# Patient Record
Sex: Female | Born: 1980 | Race: White | Hispanic: No | Marital: Married | Smoking: Never smoker
Health system: Southern US, Community
[De-identification: ages and names within clinical notes are randomized; demographics above are authoritative.]

---

## 2005-06-07 ENCOUNTER — Emergency Department: Payer: Self-pay | Admitting: Unknown Physician Specialty

## 2006-02-15 ENCOUNTER — Ambulatory Visit: Payer: Self-pay | Admitting: Unknown Physician Specialty

## 2006-02-28 ENCOUNTER — Inpatient Hospital Stay: Payer: Self-pay | Admitting: Unknown Physician Specialty

## 2006-11-19 IMAGING — US US PELV - US TRANSVAGINAL
1 series · 17 of 25 positions shown · non-contrast
Comparison: none

REASON FOR EXAM: Abdominal pain
COMMENTS:

[Series 1: us pelv - us transvaginal · 17 of 27 slices shown]
[im 1/27]
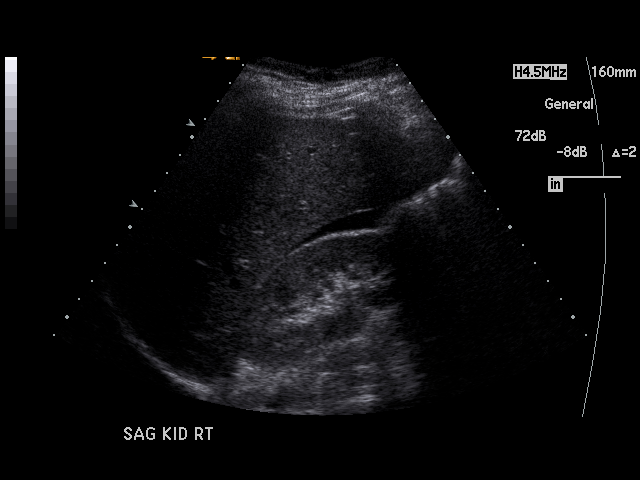
[im 3/27]
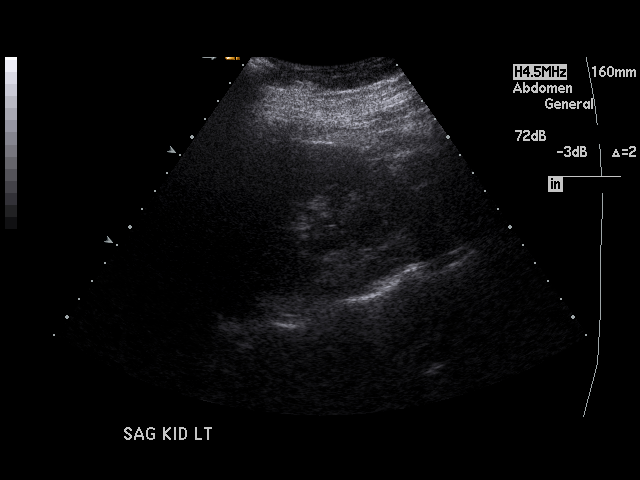
[im 4/27]
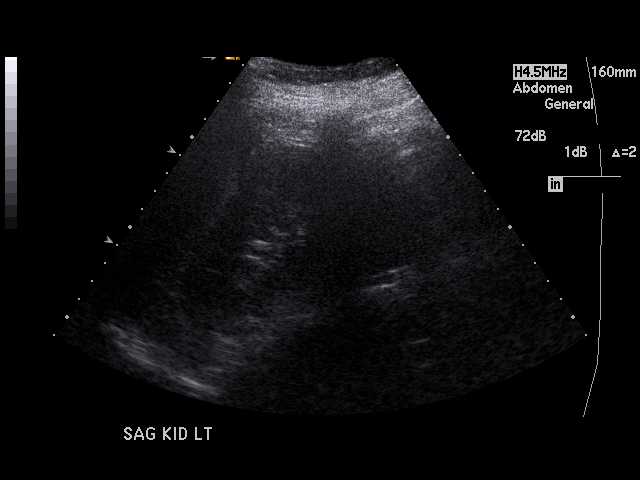
[im 6/27]
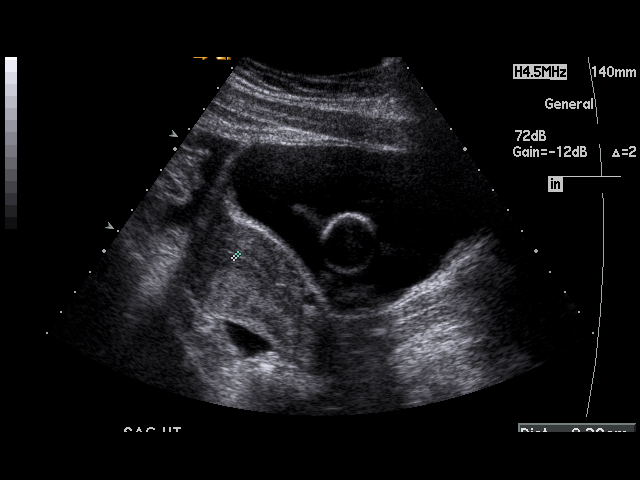
[im 7/27]
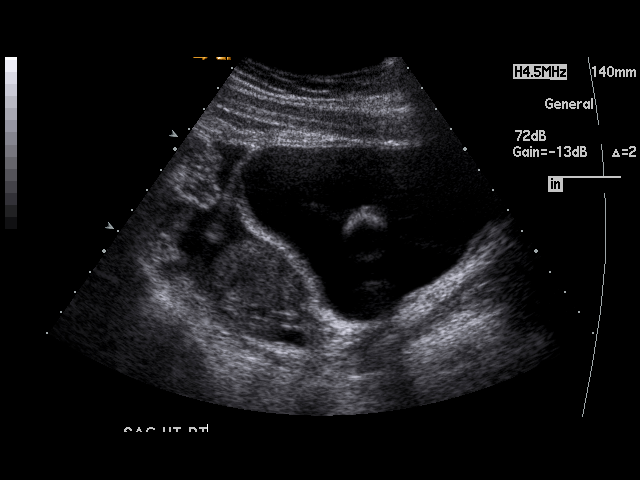
[im 9/27]
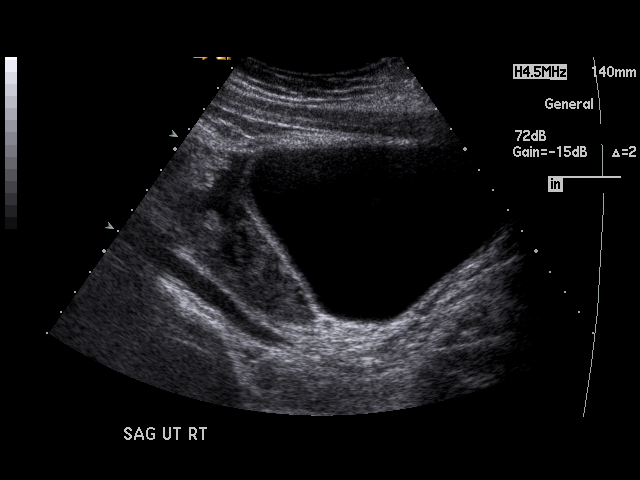
[im 10/27]
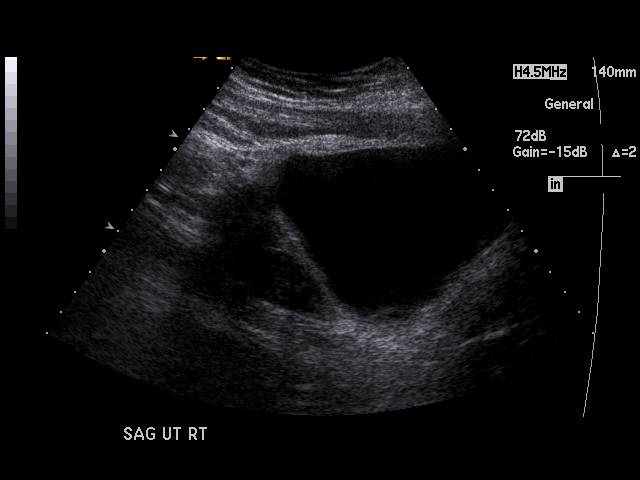
[im 12/27]
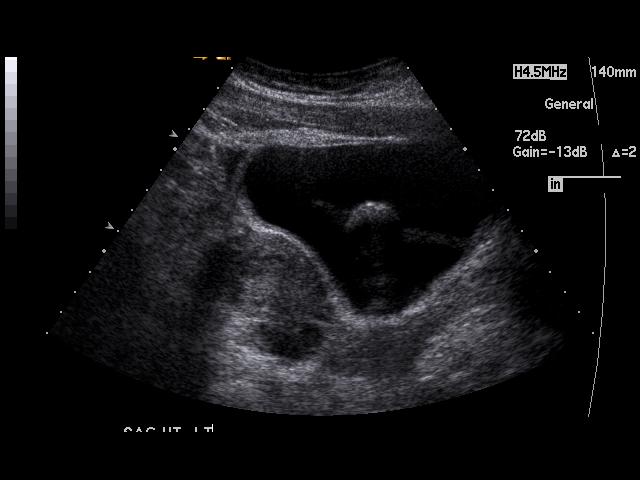
[im 14/27]
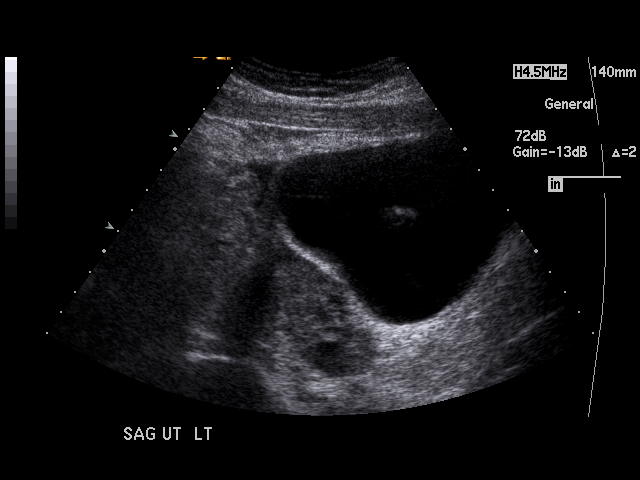
[im 15/27]
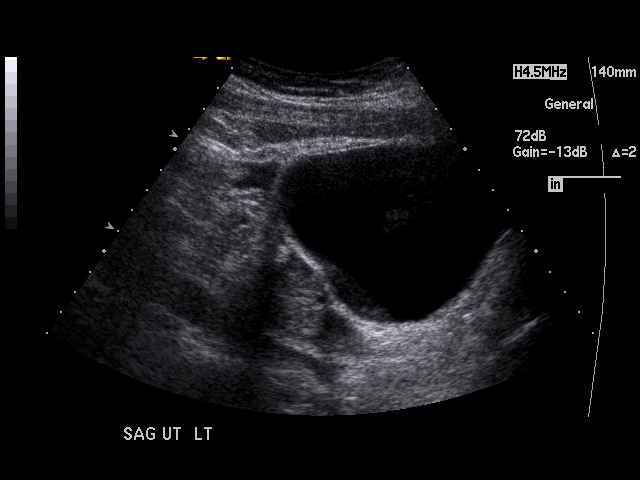
[im 17/27]
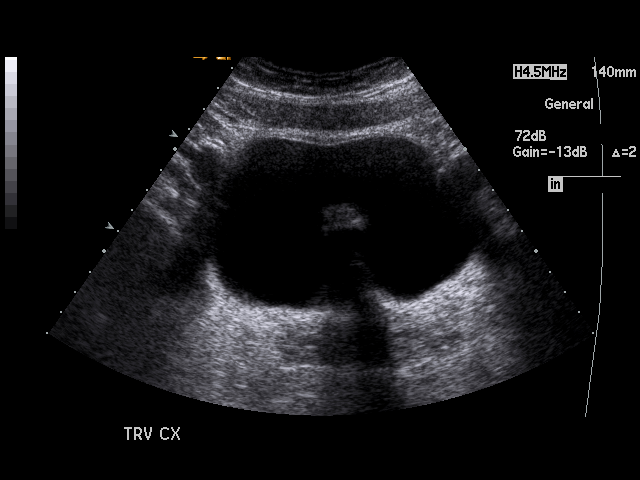
[im 18/27]
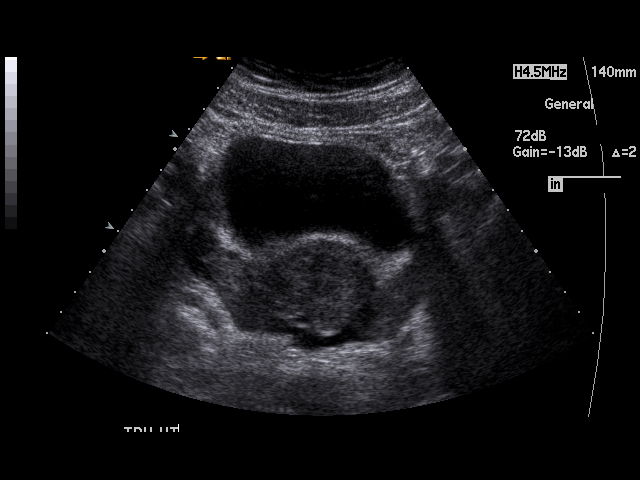
[im 20/27]
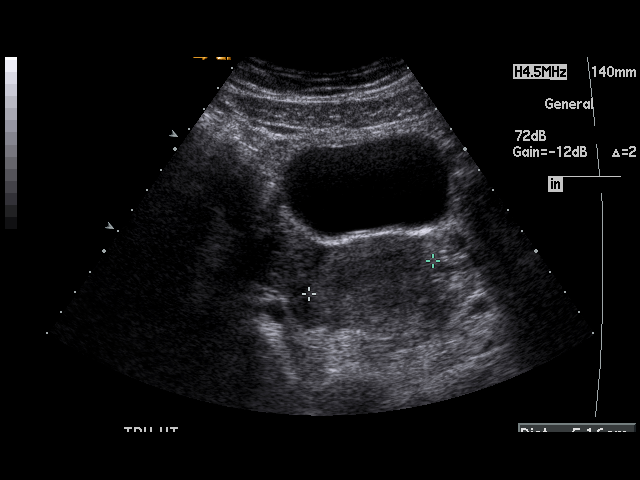
[im 21/27]
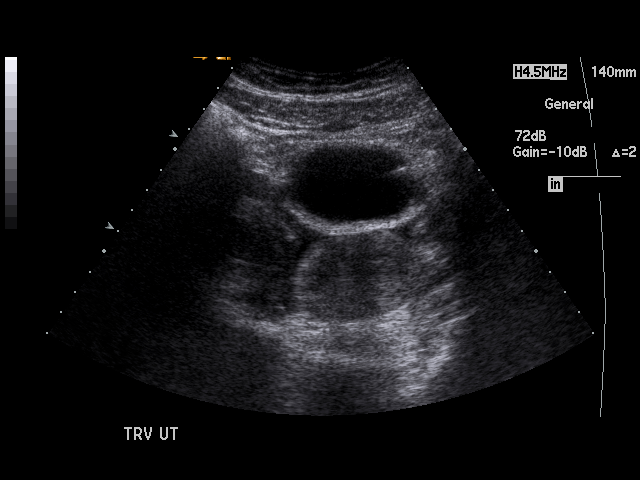
[im 23/27]
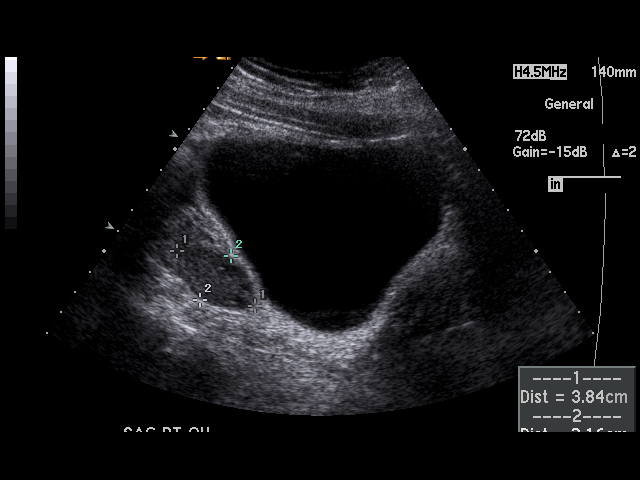
[im 24/27]
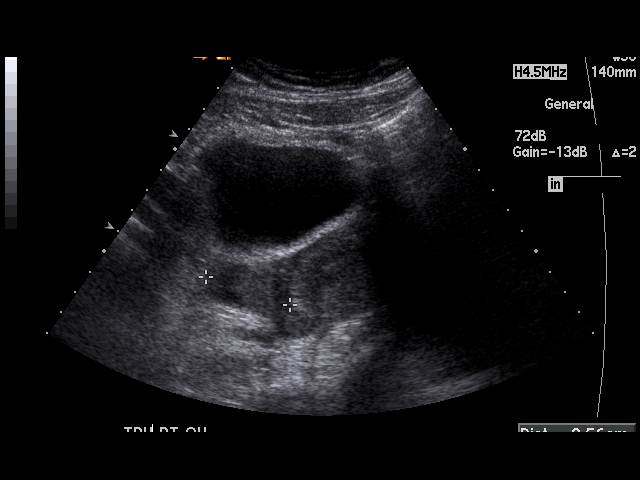
[im 27/27]
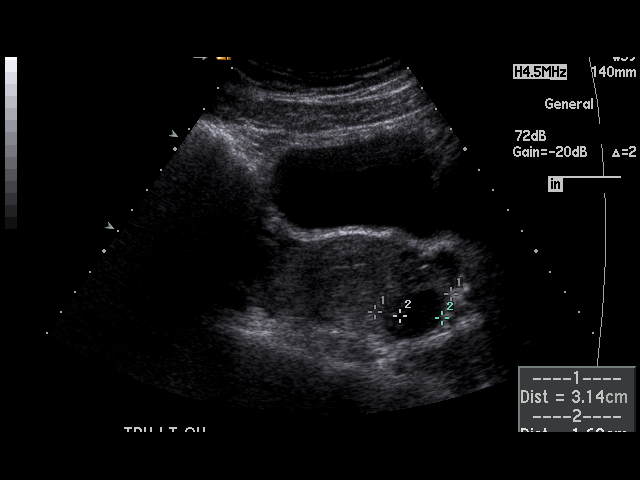

[17 of 25 positions shown; findings below may reference images not displayed]

PROCEDURE:     US  - US PELVIS MASS EXAM  - [DATE] [DATE] [DATE]  [DATE]

RESULT:       The patient is complaining of abdominal and pelvic discomfort.

The patient refused endovaginal exam due to pain.  The uterus exhibits
normal echotexture measuring 8.5 x 3.7 x 5.2 cm.  The endometrial stripe
measures 2.3 mm.  The RIGHT ovary measures 3.8 x 2.2 x 3.6 cm.  The LEFT
kidney contains an approximately 1.7 x 1.7 x 2.0 cm diameter simple
appearing cyst.  The overall maximal dimensions of the LEFT ovary are 3.3 x
2.2 x 3.1 cm.  There is a trace of fluid adjacent to the uterus in the
cul-de-sac and there is a small amount of fluid in the RIGHT perirenal
space.
IMPRESSION: 1.     There is a simple appearing cyst in the LEFT ovary.  Otherwise, the
ovaries are normal in appearance.
2.     The uterus is normal in echotexture and size with normal appearing
endometrial stripe.
3.     There is a small amount of free fluid within the pelvis.

## 2006-11-19 IMAGING — CT CT ABD-PELV W/ CM
1 of 2 series · 15 of 32 positions shown, 19 images · non-contrast
Comparison: none

REASON FOR EXAM: (1) abd pain; (2) flank pain
COMMENTS:

[Series 2: soft tissue · axial · 0.70mm/px · z∈[-649,-249]mm · 15 of 56 slices shown, 19 images]
[im 3/56  soft-tissue]
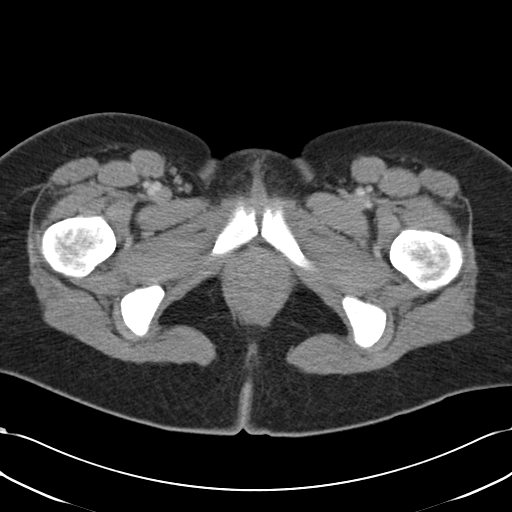
[im 3/56  bone]
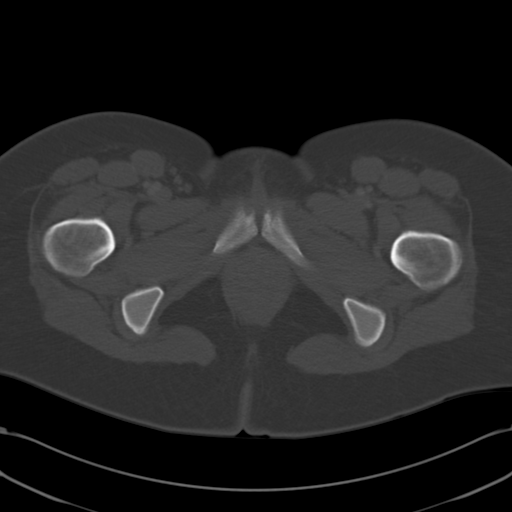
[im 7/56  soft-tissue]
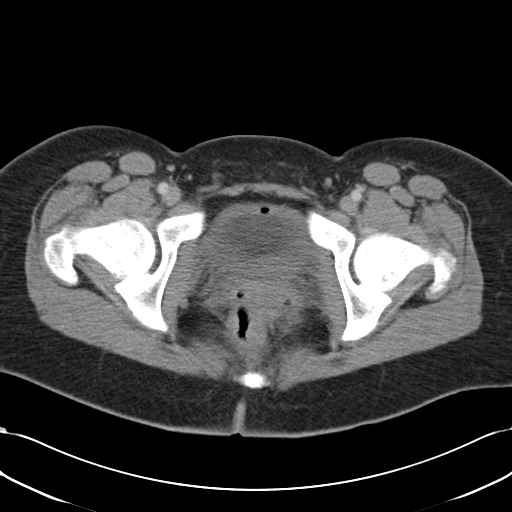
[im 11/56  soft-tissue]
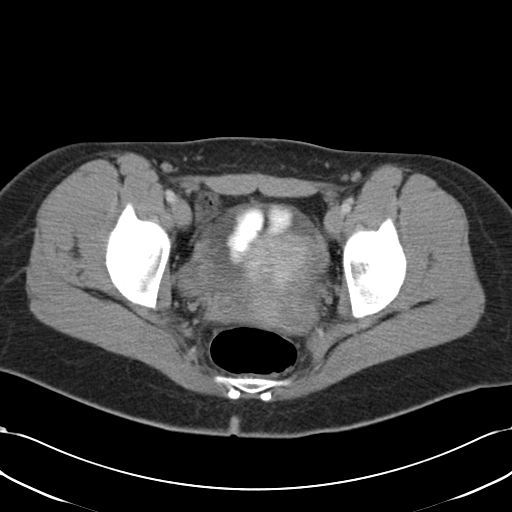
[im 15/56  soft-tissue]
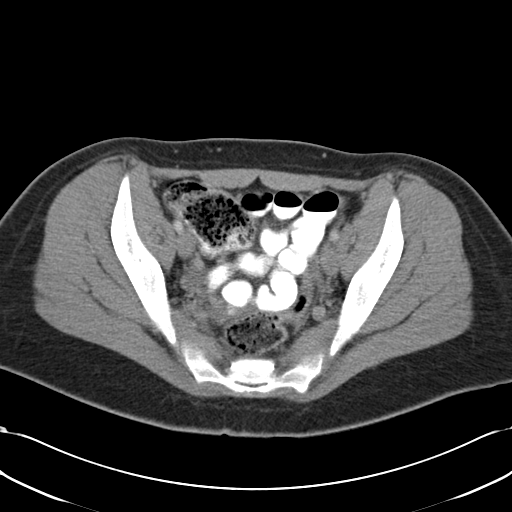
[im 20/56  soft-tissue]
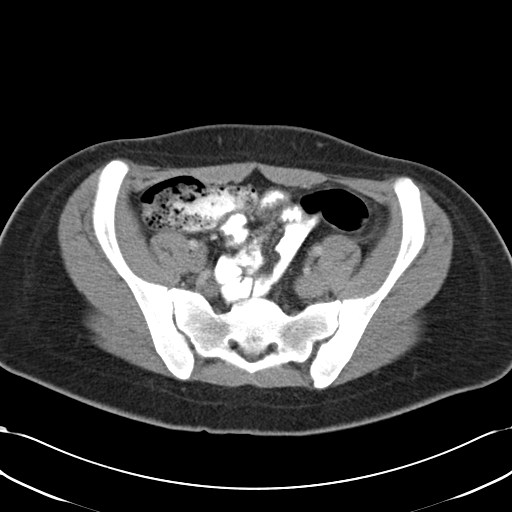
[im 24/56  soft-tissue]
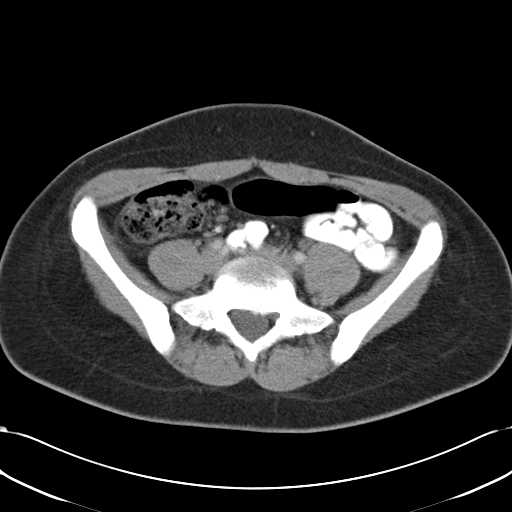
[im 28/56  soft-tissue]
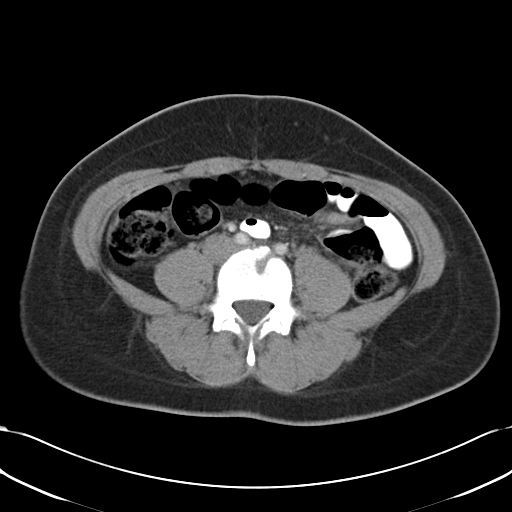
[im 32/56  soft-tissue]
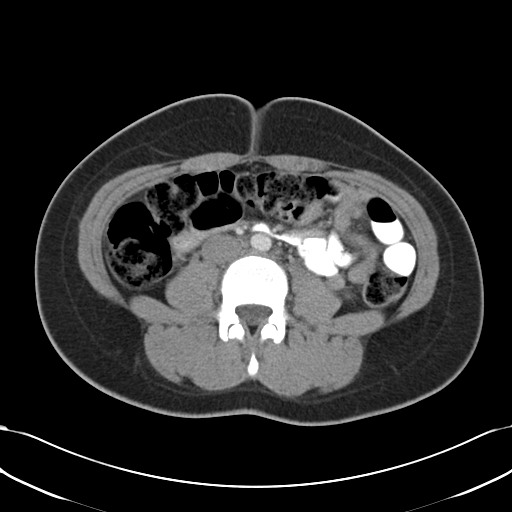
[im 36/56  soft-tissue]
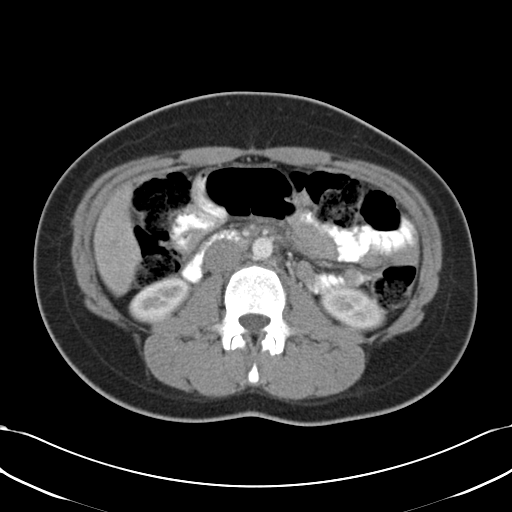
[im 36/56  bone]
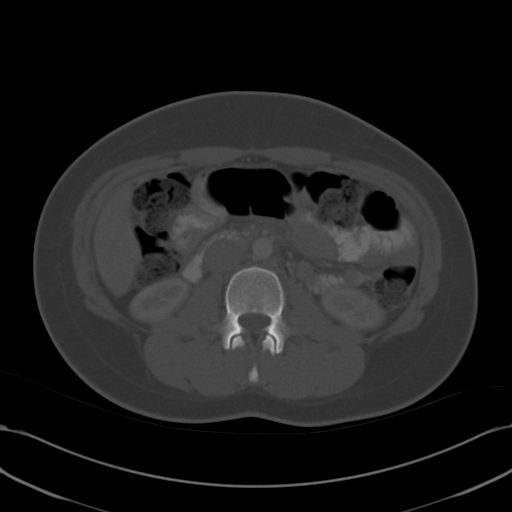
[im 41/56  soft-tissue]
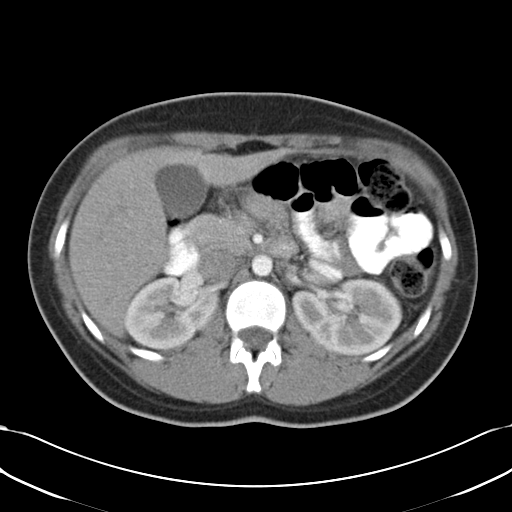
[im 45/56  soft-tissue]
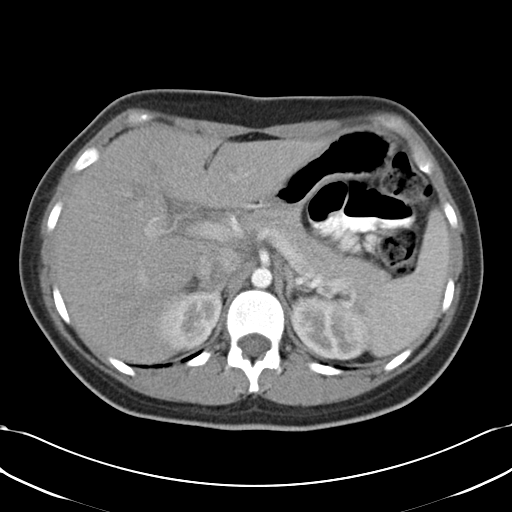
[im 47/56  lung]
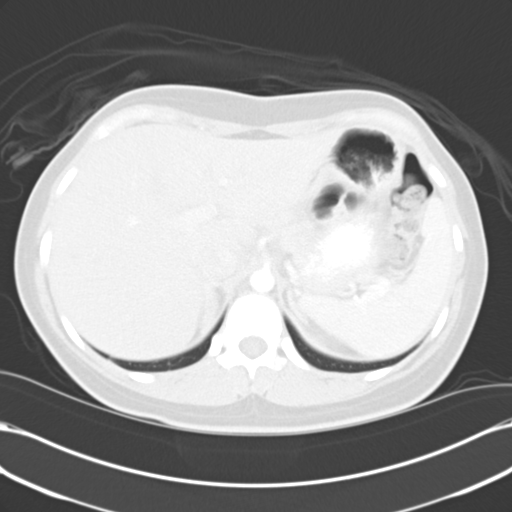
[im 49/56  soft-tissue]
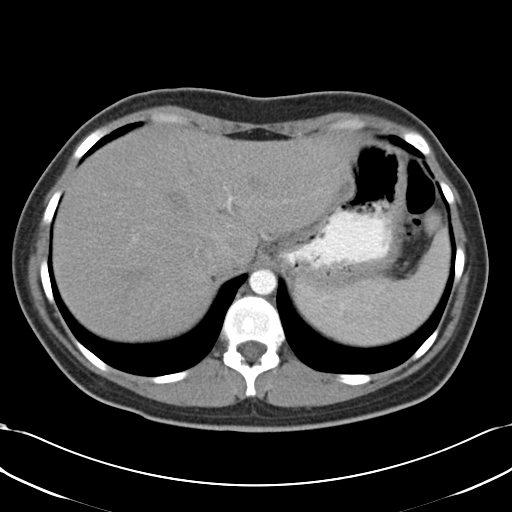
[im 49/56  lung]
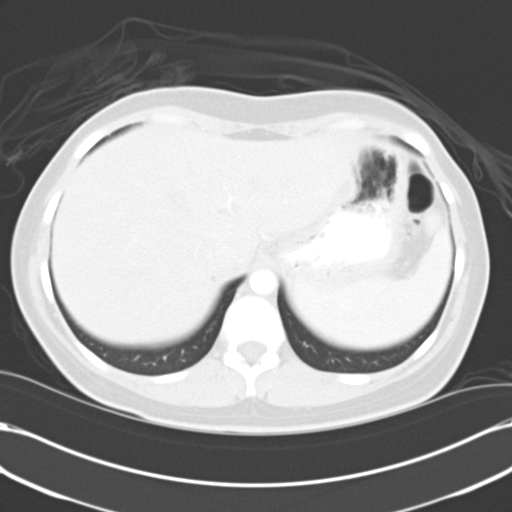
[im 51/56  lung]
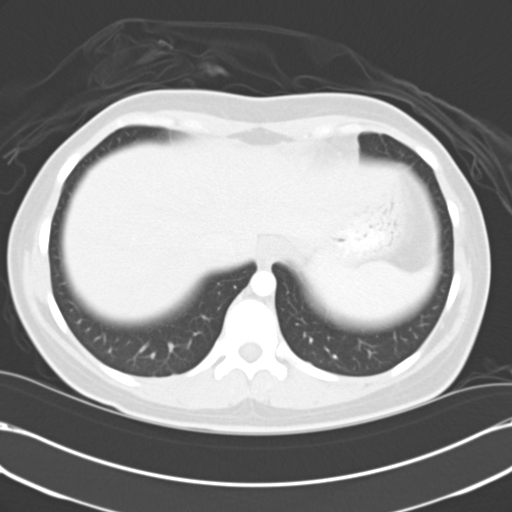
[im 53/56  soft-tissue]
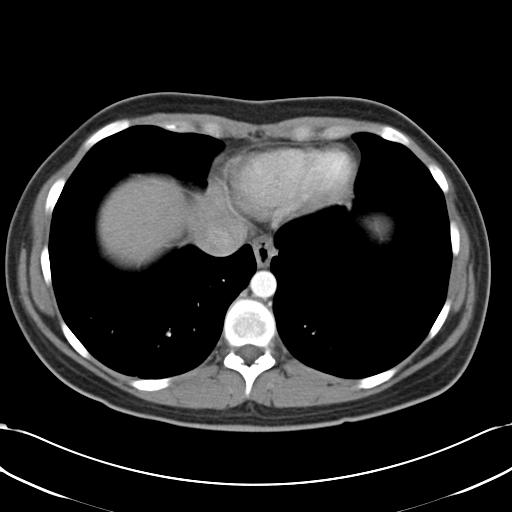
[im 53/56  lung]
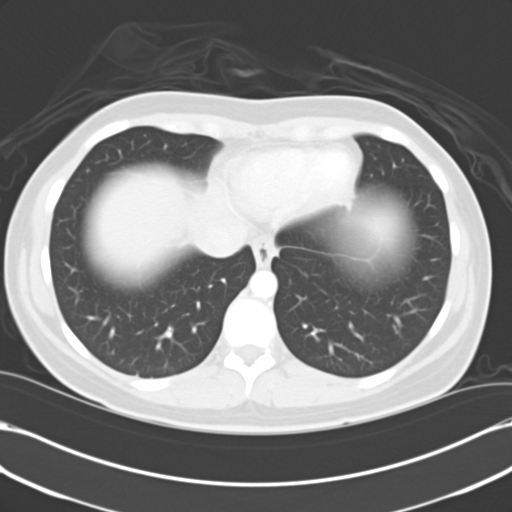

[15 of 32 positions shown; findings below may reference images not displayed]

PROCEDURE:     CT  - CT ABDOMEN / PELVIS  W  - June 07, 2005 [DATE]

RESULT:     Standard IV and oral contrast enhanced CT of the abdomen and
pelvis is obtained. The liver and spleen are normal. The adrenals and
kidneys are normal. The pancreas is normal. There is no gallbladder
distention. There is no bowel distention. The RIGHT lower quadrant is
unremarkable.  Small amount of air is noted in the bladder. This may be from
instrumentation.  Clinical correlation is suggested. Adnexa are
unremarkable.  A tiny amount of fluid adjacent to the cecum is noted.  This
is best seen on coronal reconstructions.  It is suggested that follow-up
clinical evaluation be obtained to completely exclude appendicitis.  The
appendix is not well identified.
IMPRESSION: 1)Small amount of fluid noted adjacent to the cecum seen mainly on the
coronal reconstructed views on [facility].  Close clinical follow-up to
exclude appendicitis is suggested.

2)Small amount of air is noted in the bladder. Instrumentation is the most
likely etiology.  Clinical correlation is suggested.

This report was phoned to the Emergency Department physician at the time of
the study.

## 2007-02-15 ENCOUNTER — Emergency Department: Payer: Self-pay | Admitting: Internal Medicine

## 2008-11-09 ENCOUNTER — Emergency Department: Payer: Self-pay | Admitting: Emergency Medicine

## 2013-02-16 ENCOUNTER — Ambulatory Visit: Payer: Self-pay

## 2013-02-16 LAB — URINALYSIS, COMPLETE
Glucose,UR: NEGATIVE mg/dL (ref 0–75)
Nitrite: NEGATIVE

## 2013-02-18 LAB — URINE CULTURE

## 2019-10-19 ENCOUNTER — Ambulatory Visit: Payer: Self-pay | Attending: Internal Medicine

## 2019-10-19 DIAGNOSIS — Z23 Encounter for immunization: Secondary | ICD-10-CM | POA: Insufficient documentation

## 2019-10-19 NOTE — Progress Notes (Signed)
   Covid-19 Vaccination Clinic  Name:  MYRISSA CHIPLEY    MRN: [id number] DOB: 1981/04/29  10/19/2019  Ms. Barkan was observed post Covid-19 immunization for 15 minutes without incidence. She was provided with Vaccine Information Sheet and instruction to access the V-Safe system.   Ms. Shambaugh was instructed to call 911 with any severe reactions post vaccine: Marland Kitchen Difficulty breathing  . Swelling of your face and throat  . A fast heartbeat  . A bad rash all over your body  . Dizziness and weakness    Immunizations Administered    Name Date Dose VIS Date Route   Pfizer COVID-19 Vaccine 10/19/2019 11:32 AM 0.3 mL 08/01/2019 Intramuscular   Manufacturer: [employer], [employer]   Lot: [id number]   NDC: [address]-8

## 2019-11-10 ENCOUNTER — Ambulatory Visit: Payer: Self-pay | Attending: Internal Medicine

## 2019-11-10 DIAGNOSIS — Z23 Encounter for immunization: Secondary | ICD-10-CM

## 2019-11-10 NOTE — Progress Notes (Signed)
   Covid-19 Vaccination Clinic  Name:  FELICITA NUNCIO    MRN: [id number] DOB: April 09, 1981  11/10/2019  Ms. Junker was observed post Covid-19 immunization for 15 minutes without incident. She was provided with Vaccine Information Sheet and instruction to access the V-Safe system.   Ms. Lyne was instructed to call 911 with any severe reactions post vaccine: Marland Kitchen Difficulty breathing  . Swelling of face and throat  . A fast heartbeat  . A bad rash all over body  . Dizziness and weakness   Immunizations Administered    Name Date Dose VIS Date Route   Pfizer COVID-19 Vaccine 11/10/2019 10:08 AM 0.3 mL 08/01/2019 Intramuscular   Manufacturer: [employer], [employer]   Lot: [id number]   NDC: [address]-7

## 2021-08-30 ENCOUNTER — Other Ambulatory Visit: Payer: Self-pay | Admitting: Otolaryngology

## 2021-08-30 DIAGNOSIS — R1314 Dysphagia, pharyngoesophageal phase: Secondary | ICD-10-CM

## 2021-09-06 ENCOUNTER — Ambulatory Visit
Admission: RE | Admit: 2021-09-06 | Discharge: 2021-09-06 | Disposition: A | Discharge status: Home / Self Care | Payer: BC Managed Care – PPO | Source: Ambulatory Visit | Attending: Otolaryngology | Admitting: Otolaryngology

## 2021-09-06 DIAGNOSIS — R1314 Dysphagia, pharyngoesophageal phase: Secondary | ICD-10-CM | POA: Diagnosis not present

## 2021-10-22 ENCOUNTER — Other Ambulatory Visit: Payer: Self-pay

## 2021-10-22 ENCOUNTER — Ambulatory Visit
Admission: EM | Admit: 2021-10-22 | Discharge: 2021-10-22 | Disposition: A | Discharge status: Home / Self Care | Payer: BC Managed Care – PPO

## 2021-10-22 ENCOUNTER — Encounter: Payer: Self-pay | Admitting: Emergency Medicine

## 2021-10-22 DIAGNOSIS — H1033 Unspecified acute conjunctivitis, bilateral: Secondary | ICD-10-CM

## 2021-10-22 MED ORDER — MOXIFLOXACIN HCL 0.5 % OP SOLN
1.0000 [drp] | Freq: Two times a day (BID) | OPHTHALMIC | 0 refills | Status: AC
Start: 2021-10-22 — End: ?

## 2021-10-22 NOTE — ED Provider Notes (Signed)
?[facility] ? ? ? ?CSN: [id number] ?Arrival date & time: 10/22/21  0840 ? ? ?  ? ?History   ?Chief Complaint ?Chief Complaint  ?Patient presents with  ? Eye Problem  ?  bilateral  ? ? ?HPI ?Sophia Ponce is a 41 y.o. female.  ? ?Patient presents with bilateral eye drainage with crusting, redness, pain and light sensitivity for 1 day.  Wears contacts but endorses that she takes about every night and has had no irritation while wearing the contacts.  Has attempted use of Polytrim which has been ineffective in relieving symptoms.  Denies blurred vision, tearing, fever, chills, URI symptoms.  Wearing glasses. ? ? ? ? ?History reviewed. No pertinent past medical history. ? ?There are no problems to display for this patient. ? ? ?History reviewed. No pertinent surgical history. ? ?OB History   ?No obstetric history on file. ?  ? ? ? ?Home Medications   ? ?Prior to Admission medications   ?Medication Sig Start Date End Date Taking? Authorizing Provider  ?cetirizine (ZYRTEC) 10 MG tablet Take 10 mg by mouth daily.   Yes [provider]  ?JUNEL FE 1.5/30 1.5-30 MG-MCG tablet Take 1 tablet by mouth daily. 10/09/21  Yes [provider]  ? ? ?Family History ?No family history on file. ? ?Social History ?Social History  ? ?Tobacco Use  ? Smoking status: Never  ? Smokeless tobacco: Never  ?Vaping Use  ? Vaping Use: Never used  ?Substance Use Topics  ? Alcohol use: Not Currently  ? ? ? ?Allergies   ?Erythromycin and Minocycline ? ? ?Review of Systems ?Review of Systems  ?Constitutional: Negative.   ?HENT: Negative.    ?Eyes:  Positive for photophobia, pain, discharge and redness. Negative for itching and visual disturbance.  ?Respiratory: Negative.    ?Cardiovascular: Negative.   ?Skin: Negative.   ? ? ?Physical Exam ?Triage Vital Signs ?ED Triage Vitals  ?Enc Vitals Group  ?   BP 10/22/21 0908 132/85  ?   Pulse Rate 10/22/21 0908 78  ?   Resp 10/22/21 0908 18  ?   Temp 10/22/21 0908 98.8 ?F (37.1 ?C)   ?   Temp Source 10/22/21 0908 Oral  ?   SpO2 10/22/21 0908 96 %  ?   Weight 10/22/21 0904 250 lb (113.4 kg)  ?   Height 10/22/21 0904 5\' 8"  (1.727 m)  ?   Head Circumference --   ?   Peak Flow --   ?   Pain Score 10/22/21 0904 4  ?   Pain Loc --   ?   Pain Edu? --   ?   Excl. in [place]? --   ? ?No data found. ? ?Updated Vital Signs ?BP 132/85 (BP Location: Right Arm)   Pulse 78   Temp 98.8 ?F (37.1 ?C) (Oral)   Resp 18   Ht 5\' 8"  (1.727 m)   Wt 250 lb (113.4 kg)   LMP 10/08/2021 (Approximate)   SpO2 96%   BMI 38.01 kg/m?  ? ?Visual Acuity ?Right Eye Distance:   ?Left Eye Distance:   ?Bilateral Distance:   ? ?Right Eye Near:   ?Left Eye Near:    ?Bilateral Near:    ? ?Physical Exam ?Constitutional:   ?   Appearance: Normal appearance.  ?HENT:  ?   Head: Normocephalic.  ?Eyes:  ?   Extraocular Movements: Extraocular movements intact.  ?   Comments: Erythema noted to the bilateral conjunctiva with mild periorbital  swelling, vision grossly intact, extraocular movements intact, no drainage noted  ?Pulmonary:  ?   Effort: Pulmonary effort is normal.  ?Skin: ?   General: Skin is warm and dry.  ?Neurological:  ?   Mental Status: She is alert and oriented to person, place, and time. Mental status is at baseline.  ?Psychiatric:     ?   Mood and Affect: Mood normal.     ?   Behavior: Behavior normal.  ? ? ? ?UC Treatments / Results  ?Labs ?(all labs ordered are listed, but only abnormal results are displayed) ?Labs Reviewed - No data to display ? ?EKG ? ? ?Radiology ?No results found. ? ?Procedures ?Procedures (including critical care time) ? ?Medications Ordered in UC ?Medications - No data to display ? ?Initial Impression / Assessment and Plan / UC Course  ?I have reviewed the triage vital signs and the nursing notes. ? ?Pertinent labs & imaging results that were available during my care of the patient were reviewed by me and considered in my medical decision making (see chart for details). ? ?Acute bacterial  conjunctivitis of both eyes ? ?Patient is in no signs of distress, symptoms are most consistent with bacterial conjunctivitis however Polytrim and suspects will, low suspicion for keratitis however will start coverage, moxifloxacin 7-day course prescribed, recommended disposal of contacts and wearing glasses until symptoms resolve, avoidance of make-up and eye rubbing to prevent irritation, recommended use napkins or cool compresses to remove drainage to prevent spread and antihistamines for itching, recommended follow-up with ophthalmologist if symptoms continue to persist ?Final Clinical Impressions(s) / UC Diagnoses  ? ?Final diagnoses:  ?None  ? ?Discharge Instructions   ?None ?  ? ?ED Prescriptions   ?None ?  ? ?PDMP not reviewed this encounter. ?  ?Hans Eden, NP ?10/22/21 0945 ? ?

## 2021-10-22 NOTE — ED Triage Notes (Signed)
Pt c/o bilateral eye redness, swelling, drainage, and matting. Started yesterday. Denies itching. She normally wears contacts but take them out over night. She states she took some her daughters pink eye medication and her eyes got worse.  ?

## 2021-10-22 NOTE — Discharge Instructions (Addendum)
While your symptoms are not consistent with bacterial conjunctivitis (pinkeye) you have not had relief with use of the typical medication therefore we will attempt use of a different antibiotic, if symptoms still continue to persist please follow-up with your eye doctor for further evaluation ? ?Place 1 drop of moxifloxacin into both eyes twice daily for the next 7 days ? ?Place one drop of polytrim into the effected eye every 4 hours while awake for 7 days. If the other eye starts to have symptoms you may use medication in it as well. Do not allow tip of dropper to touch eye. ?May use cool compress for comfort and to remove discharge if present. Pat the eye, do not wipe. ? ?If wearing contacts, dispose of current pair. Wear glasses until symptoms have resolved.  ? ?Do not rub eyes, this may cause more irritation. ? ?May use benadryl as needed to help if itching present. ? ?Please avoid use of eye makeup until symptoms clear. ? ?If symptoms persist after use of medication, please follow up at Urgent Care or with ophthalmologist (eye doctor)  ?

## 2021-12-01 ENCOUNTER — Other Ambulatory Visit: Payer: Self-pay | Admitting: Family Medicine

## 2021-12-01 DIAGNOSIS — Z1231 Encounter for screening mammogram for malignant neoplasm of breast: Secondary | ICD-10-CM

## 2022-01-12 ENCOUNTER — Ambulatory Visit
Admission: RE | Admit: 2022-01-12 | Discharge: 2022-01-12 | Disposition: A | Discharge status: Home / Self Care | Payer: BC Managed Care – PPO | Source: Ambulatory Visit | Attending: Family Medicine | Admitting: Family Medicine

## 2022-01-12 DIAGNOSIS — Z1231 Encounter for screening mammogram for malignant neoplasm of breast: Secondary | ICD-10-CM | POA: Diagnosis present

## 2022-12-19 ENCOUNTER — Other Ambulatory Visit: Payer: Self-pay | Admitting: Physician Assistant

## 2022-12-19 DIAGNOSIS — Z1231 Encounter for screening mammogram for malignant neoplasm of breast: Secondary | ICD-10-CM

## 2023-02-05 ENCOUNTER — Ambulatory Visit
Admission: RE | Admit: 2023-02-05 | Discharge: 2023-02-05 | Disposition: A | Discharge status: Home / Self Care | Payer: BC Managed Care – PPO | Source: Ambulatory Visit | Attending: Physician Assistant | Admitting: Physician Assistant

## 2023-02-05 DIAGNOSIS — Z1231 Encounter for screening mammogram for malignant neoplasm of breast: Secondary | ICD-10-CM | POA: Diagnosis present

## 2024-02-28 ENCOUNTER — Other Ambulatory Visit: Payer: Self-pay | Admitting: Family Medicine

## 2024-02-28 DIAGNOSIS — Z1231 Encounter for screening mammogram for malignant neoplasm of breast: Secondary | ICD-10-CM

## 2024-04-02 ENCOUNTER — Ambulatory Visit
Admission: RE | Admit: 2024-04-02 | Discharge: 2024-04-02 | Disposition: A | Discharge status: Home / Self Care | Payer: Self-pay | Source: Ambulatory Visit | Attending: Family Medicine | Admitting: Family Medicine

## 2024-04-02 DIAGNOSIS — Z1231 Encounter for screening mammogram for malignant neoplasm of breast: Secondary | ICD-10-CM | POA: Insufficient documentation
